# Patient Record
Sex: Male | Born: 1995 | Race: White | Hispanic: No | Marital: Single | Smoking: Never smoker
Health system: Southern US, Community
[De-identification: ages and names within clinical notes are randomized; demographics above are authoritative.]

---
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
## 2017-01-07 DIAGNOSIS — Z20828 Contact with and (suspected) exposure to other viral communicable diseases: Secondary | ICD-10-CM | POA: Diagnosis not present

## 2018-10-15 DIAGNOSIS — H5213 Myopia, bilateral: Secondary | ICD-10-CM | POA: Diagnosis not present

## 2019-08-26 DIAGNOSIS — B360 Pityriasis versicolor: Secondary | ICD-10-CM | POA: Diagnosis not present
